# Patient Record
Sex: Male | Born: 1940 | Race: White | Hispanic: No | Smoking: Current every day smoker
Health system: Southern US, Community
[De-identification: ages and names within clinical notes are randomized; demographics above are authoritative.]

## PROBLEM LIST (undated history)

## (undated) DIAGNOSIS — I10 Essential (primary) hypertension: Secondary | ICD-10-CM

---

## 2004-09-18 ENCOUNTER — Emergency Department (HOSPITAL_COMMUNITY): Admission: EM | Admit: 2004-09-18 | Discharge: 2004-09-18 | Payer: Self-pay | Admitting: *Deleted

## 2007-02-25 ENCOUNTER — Emergency Department (HOSPITAL_COMMUNITY): Admission: EM | Admit: 2007-02-25 | Discharge: 2007-02-25 | Payer: Self-pay | Admitting: Emergency Medicine

## 2007-05-10 ENCOUNTER — Encounter: Admission: RE | Admit: 2007-05-10 | Discharge: 2007-05-10 | Payer: Self-pay | Admitting: Family Medicine

## 2008-01-28 ENCOUNTER — Emergency Department (HOSPITAL_COMMUNITY): Admission: EM | Admit: 2008-01-28 | Discharge: 2008-01-28 | Payer: Self-pay | Admitting: Emergency Medicine

## 2009-09-29 ENCOUNTER — Emergency Department (HOSPITAL_COMMUNITY): Admission: EM | Admit: 2009-09-29 | Discharge: 2009-09-29 | Payer: Self-pay | Admitting: Emergency Medicine

## 2014-02-22 ENCOUNTER — Encounter (HOSPITAL_COMMUNITY): Payer: Self-pay | Admitting: Emergency Medicine

## 2014-02-22 ENCOUNTER — Emergency Department (HOSPITAL_COMMUNITY)
Admission: EM | Admit: 2014-02-22 | Discharge: 2014-02-22 | Disposition: A | Discharge status: Home / Self Care | Payer: Medicare Other | Attending: Emergency Medicine | Admitting: Emergency Medicine

## 2014-02-22 DIAGNOSIS — S058X9A Other injuries of unspecified eye and orbit, initial encounter: Secondary | ICD-10-CM | POA: Insufficient documentation

## 2014-02-22 DIAGNOSIS — X58XXXA Exposure to other specified factors, initial encounter: Secondary | ICD-10-CM | POA: Insufficient documentation

## 2014-02-22 DIAGNOSIS — S0502XA Injury of conjunctiva and corneal abrasion without foreign body, left eye, initial encounter: Secondary | ICD-10-CM

## 2014-02-22 DIAGNOSIS — I10 Essential (primary) hypertension: Secondary | ICD-10-CM | POA: Insufficient documentation

## 2014-02-22 DIAGNOSIS — Y929 Unspecified place or not applicable: Secondary | ICD-10-CM | POA: Insufficient documentation

## 2014-02-22 DIAGNOSIS — F17200 Nicotine dependence, unspecified, uncomplicated: Secondary | ICD-10-CM | POA: Insufficient documentation

## 2014-02-22 DIAGNOSIS — Y939 Activity, unspecified: Secondary | ICD-10-CM | POA: Insufficient documentation

## 2014-02-22 DIAGNOSIS — Y9389 Activity, other specified: Secondary | ICD-10-CM | POA: Insufficient documentation

## 2014-02-22 HISTORY — DX: Essential (primary) hypertension: I10

## 2014-02-22 MED ORDER — TETRACAINE HCL 0.5 % OP SOLN
2.0000 [drp] | Freq: Once | OPHTHALMIC | Status: AC
  Administered 2014-02-22: 2 [drp] via OPHTHALMIC
  Filled 2014-02-22: qty 2

## 2014-02-22 MED ORDER — LOTEPREDNOL ETABONATE 0.5 % OP SUSP: 1.0000 [drp] | Freq: Four times a day (QID) | OPHTHALMIC | Status: AC

## 2014-02-22 NOTE — ED Notes (Signed)
Pt presents with pain and redness to left eye. Pt states a fly flew into his eye yesterday.

## 2014-02-22 NOTE — Discharge Instructions (Signed)
Rinse eyes with tap water. Apply 1 drop 4 times a day for the next 2 days.  Followup with eye Dr. if not getting better. Phone number given to

## 2014-02-22 NOTE — ED Notes (Signed)
Patient seen and evaluated by the primary care physician.

## 2014-02-22 NOTE — ED Provider Notes (Signed)
CSN: [id number]     Arrival date & time 02/22/14  1031 History  This chart was scribed for Donnetta HutchingBrian Courtlyn Aki, MD by Ardelia Memsylan Malpass, ED Scribe. This patient was seen in room [facility]03/[facility]03 and the patient's care was started at 10:46 AM.   Chief Complaint  Patient presents with  . Eye Pain    The history is provided by the patient. No language interpreter was used.    HPI Comments: Xavier Dawson is a 73 y.o. Male with a history of HTN who presents to the Emergency Department complaining of intermittent, mild left eye pain onset after a fly flew into his eye yesterday. He states that he had "burning, stinging" pain for 2-3 minutes after the incident, and some intermittent pain since. He states that his left eye has been tearing today. He denies visual disturbances or any other symptoms.    Past Medical History  Diagnosis Date  . Hypertension    History reviewed. No pertinent past surgical history. History reviewed. No pertinent family history. History  Substance Use Topics  . Smoking status: Current Every Day Smoker  . Smokeless tobacco: Not on file  . Alcohol Use: No    Review of Systems A complete 10 system review of systems was obtained and all systems are negative except as noted in the HPI and PMH.   Allergies  Review of patient's allergies indicates no known allergies.  Home Medications   Prior to Admission medications   Not on File   Triage Vitals: BP 156/80  Pulse 62  Temp(Src) 97.2 F (36.2 C) (Oral)  Resp 18  Ht 6' (1.829 m)  Wt 214 lb (97.07 kg)  BMI 29.02 kg/m2  SpO2 98%  Physical Exam  Nursing note and vitals reviewed. Constitutional: He is oriented to person, place, and time. He appears well-developed and well-nourished.  HENT:  Head: Normocephalic and atraumatic.  Eyes: EOM are normal. Pupils are equal, round, and reactive to light.  Right eye normal. Left eye shows boggy conjunctiva with injection  Pulmonary/Chest: Breath sounds normal.  Musculoskeletal:  Normal range of motion.  Neurological: He is alert and oriented to person, place, and time.  Skin: Skin is warm and dry.  Psychiatric: He has a normal mood and affect. His behavior is normal.    ED Course  Procedures (including critical care time)..........Marland Kitchen.PROCEDURE NOTE:    Fluorescein, Wood's light.  Corneal abrasion at 3:00. No foreign body.  DIAGNOSTIC STUDIES: Oxygen Saturation is 98% on RA, normal by my interpretation.    COORDINATION OF CARE: 10:50 AM- Discussed plan to examine pt's left eye. Pt advised of plan for treatment and pt agrees.  Labs Review Labs Reviewed - No data to display  Imaging Review No results found.   EKG Interpretation None      MDM   Final diagnoses:  Corneal abrasion, left    Eye exam reveals a corneal abrasion. No embedded foreign body. Rx Lotemax eyedrops. Referral to ophthalmology if not getting better   I personally performed the services described in this documentation, which was scribed in my presence. The recorded information has been reviewed and is accurate.   Donnetta HutchingBrian Emmry Hinsch, MD 02/22/14 [phone]
